# Patient Record
Sex: Male | Born: 1990 | Race: White | Hispanic: No | Marital: Single | Smoking: Current every day smoker
Health system: Southern US, Community
[De-identification: ages and names within clinical notes are randomized; demographics above are authoritative.]

---
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
## 2011-01-24 ENCOUNTER — Emergency Department (HOSPITAL_COMMUNITY)
Admission: EM | Admit: 2011-01-24 | Discharge: 2011-01-24 | Disposition: A | Discharge status: Home / Self Care | Payer: Medicaid Other | Attending: Emergency Medicine | Admitting: Emergency Medicine

## 2011-01-24 DIAGNOSIS — B86 Scabies: Secondary | ICD-10-CM | POA: Insufficient documentation

## 2011-05-05 LAB — RAPID URINE DRUG SCREEN, HOSP PERFORMED
Cocaine: NOT DETECTED
Opiates: NOT DETECTED

## 2011-05-05 LAB — BASIC METABOLIC PANEL
CO2: 31 mEq/L (ref 19–32)
Calcium: 9.5 mg/dL (ref 8.4–10.5)
Creatinine, Ser: 0.79 mg/dL (ref 0.4–1.5)
Glucose, Bld: 91 mg/dL (ref 70–99)

## 2011-05-05 LAB — URINALYSIS, ROUTINE W REFLEX MICROSCOPIC
Bilirubin Urine: NEGATIVE
Glucose, UA: NEGATIVE mg/dL
Ketones, ur: NEGATIVE mg/dL
pH: 7 (ref 5.0–8.0)

## 2011-05-05 LAB — DIFFERENTIAL
Basophils Absolute: 0 10*3/uL (ref 0.0–0.1)
Basophils Relative: 1 % (ref 0–1)
Neutro Abs: 5.5 10*3/uL (ref 1.7–8.0)
Neutrophils Relative %: 64 % (ref 43–71)

## 2011-05-05 LAB — CBC
MCHC: 33.5 g/dL (ref 31.0–37.0)
Platelets: 250 10*3/uL (ref 150–400)
RDW: 12.8 % (ref 11.4–15.5)

## 2014-07-22 ENCOUNTER — Emergency Department (HOSPITAL_COMMUNITY): Payer: Medicaid Other

## 2014-07-22 ENCOUNTER — Encounter (HOSPITAL_COMMUNITY): Payer: Self-pay

## 2014-07-22 ENCOUNTER — Emergency Department (HOSPITAL_COMMUNITY)
Admission: EM | Admit: 2014-07-22 | Discharge: 2014-07-22 | Disposition: A | Discharge status: Home / Self Care | Payer: Self-pay | Attending: Emergency Medicine | Admitting: Emergency Medicine

## 2014-07-22 DIAGNOSIS — S6992XA Unspecified injury of left wrist, hand and finger(s), initial encounter: Secondary | ICD-10-CM | POA: Insufficient documentation

## 2014-07-22 DIAGNOSIS — R042 Hemoptysis: Secondary | ICD-10-CM | POA: Insufficient documentation

## 2014-07-22 DIAGNOSIS — Y9289 Other specified places as the place of occurrence of the external cause: Secondary | ICD-10-CM | POA: Insufficient documentation

## 2014-07-22 DIAGNOSIS — R059 Cough, unspecified: Secondary | ICD-10-CM

## 2014-07-22 DIAGNOSIS — S99911A Unspecified injury of right ankle, initial encounter: Secondary | ICD-10-CM

## 2014-07-22 DIAGNOSIS — J40 Bronchitis, not specified as acute or chronic: Secondary | ICD-10-CM

## 2014-07-22 DIAGNOSIS — W19XXXA Unspecified fall, initial encounter: Secondary | ICD-10-CM

## 2014-07-22 DIAGNOSIS — S0081XA Abrasion of other part of head, initial encounter: Secondary | ICD-10-CM | POA: Insufficient documentation

## 2014-07-22 DIAGNOSIS — S3991XA Unspecified injury of abdomen, initial encounter: Secondary | ICD-10-CM | POA: Insufficient documentation

## 2014-07-22 DIAGNOSIS — S79911A Unspecified injury of right hip, initial encounter: Secondary | ICD-10-CM | POA: Insufficient documentation

## 2014-07-22 DIAGNOSIS — S20412A Abrasion of left back wall of thorax, initial encounter: Secondary | ICD-10-CM | POA: Insufficient documentation

## 2014-07-22 DIAGNOSIS — M79642 Pain in left hand: Secondary | ICD-10-CM

## 2014-07-22 DIAGNOSIS — S0990XA Unspecified injury of head, initial encounter: Secondary | ICD-10-CM

## 2014-07-22 DIAGNOSIS — S30810A Abrasion of lower back and pelvis, initial encounter: Secondary | ICD-10-CM | POA: Insufficient documentation

## 2014-07-22 DIAGNOSIS — D72829 Elevated white blood cell count, unspecified: Secondary | ICD-10-CM | POA: Insufficient documentation

## 2014-07-22 DIAGNOSIS — S4992XA Unspecified injury of left shoulder and upper arm, initial encounter: Secondary | ICD-10-CM | POA: Insufficient documentation

## 2014-07-22 DIAGNOSIS — Z720 Tobacco use: Secondary | ICD-10-CM | POA: Insufficient documentation

## 2014-07-22 DIAGNOSIS — S0003XA Contusion of scalp, initial encounter: Secondary | ICD-10-CM | POA: Insufficient documentation

## 2014-07-22 DIAGNOSIS — J209 Acute bronchitis, unspecified: Secondary | ICD-10-CM | POA: Insufficient documentation

## 2014-07-22 DIAGNOSIS — R05 Cough: Secondary | ICD-10-CM

## 2014-07-22 DIAGNOSIS — Y9351 Activity, roller skating (inline) and skateboarding: Secondary | ICD-10-CM | POA: Insufficient documentation

## 2014-07-22 DIAGNOSIS — S6990XA Unspecified injury of unspecified wrist, hand and finger(s), initial encounter: Secondary | ICD-10-CM

## 2014-07-22 DIAGNOSIS — S060X0A Concussion without loss of consciousness, initial encounter: Secondary | ICD-10-CM

## 2014-07-22 DIAGNOSIS — R52 Pain, unspecified: Secondary | ICD-10-CM

## 2014-07-22 DIAGNOSIS — Y998 Other external cause status: Secondary | ICD-10-CM | POA: Insufficient documentation

## 2014-07-22 LAB — URINALYSIS, ROUTINE W REFLEX MICROSCOPIC
Bilirubin Urine: NEGATIVE
GLUCOSE, UA: NEGATIVE mg/dL
HGB URINE DIPSTICK: NEGATIVE
Ketones, ur: 15 mg/dL — AB
Leukocytes, UA: NEGATIVE
Nitrite: NEGATIVE
PH: 5.5 (ref 5.0–8.0)
Protein, ur: NEGATIVE mg/dL
SPECIFIC GRAVITY, URINE: 1.03 (ref 1.005–1.030)
Urobilinogen, UA: 0.2 mg/dL (ref 0.0–1.0)

## 2014-07-22 LAB — COMPREHENSIVE METABOLIC PANEL
ALT: 16 U/L (ref 0–53)
AST: 25 U/L (ref 0–37)
Albumin: 4.6 g/dL (ref 3.5–5.2)
Alkaline Phosphatase: 57 U/L (ref 39–117)
Anion gap: 8 (ref 5–15)
BUN: 13 mg/dL (ref 6–23)
CALCIUM: 9.2 mg/dL (ref 8.4–10.5)
CO2: 24 mmol/L (ref 19–32)
Chloride: 108 mEq/L (ref 96–112)
Creatinine, Ser: 1.08 mg/dL (ref 0.50–1.35)
GFR calc Af Amer: >90 mL/min (ref >90)
GFR calc non Af Amer: >90 mL/min (ref >90)
Glucose, Bld: 102 mg/dL — ABNORMAL HIGH (ref 70–99)
POTASSIUM: 3.7 mmol/L (ref 3.5–5.1)
SODIUM: 140 mmol/L (ref 135–145)
TOTAL PROTEIN: 7.3 g/dL (ref 6.0–8.3)
Total Bilirubin: 0.6 mg/dL (ref 0.3–1.2)

## 2014-07-22 LAB — RAPID URINE DRUG SCREEN, HOSP PERFORMED
AMPHETAMINES: NOT DETECTED
BENZODIAZEPINES: POSITIVE — AB
Barbiturates: NOT DETECTED
COCAINE: POSITIVE — AB
OPIATES: NOT DETECTED
TETRAHYDROCANNABINOL: POSITIVE — AB

## 2014-07-22 LAB — CBC WITH DIFFERENTIAL/PLATELET
BASOS ABS: 0 10*3/uL (ref 0.0–0.1)
Basophils Relative: 0 % (ref 0–1)
EOS ABS: 0.2 10*3/uL (ref 0.0–0.7)
EOS PCT: 2 % (ref 0–5)
HCT: 43.6 % (ref 39.0–52.0)
Hemoglobin: 15.1 g/dL (ref 13.0–17.0)
LYMPHS PCT: 19 % (ref 12–46)
Lymphs Abs: 2.3 10*3/uL (ref 0.7–4.0)
MCH: 30.1 pg (ref 26.0–34.0)
MCHC: 34.6 g/dL (ref 30.0–36.0)
MCV: 87 fL (ref 78.0–100.0)
Monocytes Absolute: 0.8 10*3/uL (ref 0.1–1.0)
Monocytes Relative: 7 % (ref 3–12)
NEUTROS PCT: 72 % (ref 43–77)
Neutro Abs: 8.9 10*3/uL — ABNORMAL HIGH (ref 1.7–7.7)
PLATELETS: 286 10*3/uL (ref 150–400)
RBC: 5.01 MIL/uL (ref 4.22–5.81)
RDW: 12.1 % (ref 11.5–15.5)
WBC: 12.3 10*3/uL — AB (ref 4.0–10.5)

## 2014-07-22 LAB — TROPONIN I: Troponin I: <0.03 ng/mL (ref <0.031)

## 2014-07-22 LAB — ETHANOL: Alcohol, Ethyl (B): <5 mg/dL (ref 0–9)

## 2014-07-22 MED ORDER — ACETAMINOPHEN 325 MG PO TABS: 325.0000 mg | ORAL_TABLET | Freq: Four times a day (QID) | ORAL | Status: AC | PRN

## 2014-07-22 MED ORDER — SODIUM CHLORIDE 0.9 % IV BOLUS (SEPSIS)
500.0000 mL | Freq: Once | INTRAVENOUS | Status: AC
  Administered 2014-07-22: 500 mL via INTRAVENOUS

## 2014-07-22 MED ORDER — AZITHROMYCIN 250 MG PO TABS
250.0000 mg | ORAL_TABLET | Freq: Every day | ORAL | Status: AC
Start: 2014-07-22 — End: ?

## 2014-07-22 MED ORDER — ACETAMINOPHEN 325 MG PO TABS
650.0000 mg | ORAL_TABLET | Freq: Four times a day (QID) | ORAL | Status: DC | PRN
Start: 2014-07-22 — End: 2014-07-22

## 2014-07-22 MED ORDER — SODIUM CHLORIDE 0.9 % IV BOLUS (SEPSIS)
1000.0000 mL | Freq: Once | INTRAVENOUS | Status: AC
  Administered 2014-07-22: 1000 mL via INTRAVENOUS

## 2014-07-22 MED ORDER — AZITHROMYCIN 250 MG PO TABS: 250.0000 mg | ORAL_TABLET | Freq: Every day | ORAL | Status: DC

## 2014-07-22 MED ORDER — ALBUTEROL SULFATE HFA 108 (90 BASE) MCG/ACT IN AERS
2.0000 | INHALATION_SPRAY | Freq: Once | RESPIRATORY_TRACT | Status: AC
  Administered 2014-07-22: 2 via RESPIRATORY_TRACT
  Filled 2014-07-22: qty 6.7

## 2014-07-22 NOTE — ED Notes (Signed)
Paged ortho to place thumb spica.  
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
## 2014-07-22 NOTE — ED Notes (Signed)
Pt still in radiology.

## 2014-07-22 NOTE — ED Notes (Signed)
Patient is not answering the pager [phone] and not answering by name.  Called [phone]

## 2014-07-22 NOTE — ED Notes (Signed)
Pt's friend brought pt's cell phone and charger to room

## 2014-07-22 NOTE — ED Notes (Signed)
Placed cam walker on pt's right ankle

## 2014-07-22 NOTE — Progress Notes (Signed)
Orthopedic Tech Progress Note Patient Details:  Lucy Chrishomas D Wragg April 08, 1991 [id number]  Ortho Devices Type of Ortho Device: Thumb velcro splint, Crutches Ortho Device/Splint Location: LUE Ortho Device/Splint Interventions: Ordered, Application, Adjustment   Jennye MoccasinHughes, Viviann Broyles Craig 07/22/2014, 6:57 PM

## 2014-07-22 NOTE — Discharge Instructions (Signed)
Please call your doctor for a followup appointment within 24-48 hours. When you talk to your doctor please let them know that you were seen in the emergency department and have them acquire all of your records so that they can discuss the findings with you and formulate a treatment plan to fully care for your new and ongoing problems. Please call and set-up an appointment with hand specialist and orthopedics Please call and set-up an appointment with health and wellness center Please avoid any physical or strenuous activity  Please wear a helmet at all times Please no physical activity - if you have another head injury this can lead to irreversible brain damage Please continue to monitor symptoms closely and if symptoms are to worsen or change (fever greater than 101, chills, sweating, nausea, vomiting, chest pain, shortness of breathe, difficulty breathing, weakness, numbness, tingling, worsening or changes to pain pattern, fall, injury, neck pain, dizziness, visual changes, inability to keep food or fluids down) please report back to the Emergency Department immediately.   Concussion A concussion, or closed-head injury, is a brain injury caused by a direct blow to the head or by a quick and sudden movement (jolt) of the head or neck. Concussions are usually not life-threatening. Even so, the effects of a concussion can be serious. If you have had a concussion before, you are more likely to experience concussion-like symptoms after a direct blow to the head.  CAUSES  Direct blow to the head, such as from running into another player during a soccer game, being hit in a fight, or hitting your head on a hard surface.  A jolt of the head or neck that causes the brain to move back and forth inside the skull, such as in a car crash. SIGNS AND SYMPTOMS The signs of a concussion can be hard to notice. Early on, they may be missed by you, family members, and health care providers. You may look fine but act  or feel differently. Symptoms are usually temporary, but they may last for days, weeks, or even longer. Some symptoms may appear right away while others may not show up for hours or days. Every head injury is different. Symptoms include:  Mild to moderate headaches that will not go away.  A feeling of pressure inside your head.  Having more trouble than usual:  Learning or remembering things you have heard.  Answering questions.  Paying attention or concentrating.  Organizing daily tasks.  Making decisions and solving problems.  Slowness in thinking, acting or reacting, speaking, or reading.  Getting lost or being easily confused.  Feeling tired all the time or lacking energy (fatigued).  Feeling drowsy.  Sleep disturbances.  Sleeping more than usual.  Sleeping less than usual.  Trouble falling asleep.  Trouble sleeping (insomnia).  Loss of balance or feeling lightheaded or dizzy.  Nausea or vomiting.  Numbness or tingling.  Increased sensitivity to:  Sounds.  Lights.  Distractions.  Vision problems or eyes that tire easily.  Diminished sense of taste or smell.  Ringing in the ears.  Mood changes such as feeling sad or anxious.  Becoming easily irritated or angry for little or no reason.  Lack of motivation.  Seeing or hearing things other people do not see or hear (hallucinations). DIAGNOSIS Your health care provider can usually diagnose a concussion based on a description of your injury and symptoms. He or she will ask whether you passed out (lost consciousness) and whether you are having trouble remembering events  that happened right before and during your injury. Your evaluation might include:  A brain scan to look for signs of injury to the brain. Even if the test shows no injury, you may still have a concussion.  Blood tests to be sure other problems are not present. TREATMENT  Concussions are usually treated in an emergency department,  in urgent care, or at a clinic. You may need to stay in the hospital overnight for further treatment.  Tell your health care provider if you are taking any medicines, including prescription medicines, over-the-counter medicines, and natural remedies. Some medicines, such as blood thinners (anticoagulants) and aspirin, may increase the chance of complications. Also tell your health care provider whether you have had alcohol or are taking illegal drugs. This information may affect treatment.  Your health care provider will send you home with important instructions to follow.  How fast you will recover from a concussion depends on many factors. These factors include how severe your concussion is, what part of your brain was injured, your age, and how healthy you were before the concussion.  Most people with mild injuries recover fully. Recovery can take time. In general, recovery is slower in older persons. Also, persons who have had a concussion in the past or have other medical problems may find that it takes longer to recover from their current injury. HOME CARE INSTRUCTIONS General Instructions  Carefully follow the directions your health care provider gave you.  Only take over-the-counter or prescription medicines for pain, discomfort, or fever as directed by your health care provider.  Take only those medicines that your health care provider has approved.  Do not drink alcohol until your health care provider says you are well enough to do so. Alcohol and certain other drugs may slow your recovery and can put you at risk of further injury.  If it is harder than usual to remember things, write them down.  If you are easily distracted, try to do one thing at a time. For example, do not try to watch TV while fixing dinner.  Talk with family members or close friends when making important decisions.  Keep all follow-up appointments. Repeated evaluation of your symptoms is recommended for your  recovery.  Watch your symptoms and tell others to do the same. Complications sometimes occur after a concussion. Older adults with a brain injury may have a higher risk of serious complications, such as a blood clot on the brain.  Tell your teachers, school nurse, school counselor, coach, athletic trainer, or work Production designer, theatre/television/film about your injury, symptoms, and restrictions. Tell them about what you can or cannot do. They should watch for:  Increased problems with attention or concentration.  Increased difficulty remembering or learning new information.  Increased time needed to complete tasks or assignments.  Increased irritability or decreased ability to cope with stress.  Increased symptoms.  Rest. Rest helps the brain to heal. Make sure you:  Get plenty of sleep at night. Avoid staying up late at night.  Keep the same bedtime hours on weekends and weekdays.  Rest during the day. Take daytime naps or rest breaks when you feel tired.  Limit activities that require a lot of thought or concentration. These include:  Doing homework or job-related work.  Watching TV.  Working on the computer.  Avoid any situation where there is potential for another head injury (football, hockey, soccer, basketball, martial arts, downhill snow sports and horseback riding). Your condition will get worse every time you  experience a concussion. You should avoid these activities until you are evaluated by the appropriate follow-up health care providers. Returning To Your Regular Activities You will need to return to your normal activities slowly, not all at once. You must give your body and brain enough time for recovery.  Do not return to sports or other athletic activities until your health care provider tells you it is safe to do so.  Ask your health care provider when you can drive, ride a bicycle, or operate heavy machinery. Your ability to react may be slower after a brain injury. Never do these  activities if you are dizzy.  Ask your health care provider about when you can return to work or school. Preventing Another Concussion It is very important to avoid another brain injury, especially before you have recovered. In rare cases, another injury can lead to permanent brain damage, brain swelling, or death. The risk of this is greatest during the first 7-10 days after a head injury. Avoid injuries by:  Wearing a seat belt when riding in a car.  Drinking alcohol only in moderation.  Wearing a helmet when biking, skiing, skateboarding, skating, or doing similar activities.  Avoiding activities that could lead to a second concussion, such as contact or recreational sports, until your health care provider says it is okay.  Taking safety measures in your home.  Remove clutter and tripping hazards from floors and stairways.  Use grab bars in bathrooms and handrails by stairs.  Place non-slip mats on floors and in bathtubs.  Improve lighting in dim areas. SEEK MEDICAL CARE IF:  You have increased problems paying attention or concentrating.  You have increased difficulty remembering or learning new information.  You need more time to complete tasks or assignments than before.  You have increased irritability or decreased ability to cope with stress.  You have more symptoms than before. Seek medical care if you have any of the following symptoms for more than 2 weeks after your injury:  Lasting (chronic) headaches.  Dizziness or balance problems.  Nausea.  Vision problems.  Increased sensitivity to noise or light.  Depression or mood swings.  Anxiety or irritability.  Memory problems.  Difficulty concentrating or paying attention.  Sleep problems.  Feeling tired all the time. SEEK IMMEDIATE MEDICAL CARE IF:  You have severe or worsening headaches. These may be a sign of a blood clot in the brain.  You have weakness (even if only in one hand, leg, or part of  the face).  You have numbness.  You have decreased coordination.  You vomit repeatedly.  You have increased sleepiness.  One pupil is larger than the other.  You have convulsions.  You have slurred speech.  You have increased confusion. This may be a sign of a blood clot in the brain.  You have increased restlessness, agitation, or irritability.  You are unable to recognize people or places.  You have neck pain.  It is difficult to wake you up.  You have unusual behavior changes.  You lose consciousness. MAKE SURE YOU:  Understand these instructions.  Will watch your condition.  Will get help right away if you are not doing well or get worse. Document Released: 10/07/2003 Document Revised: 07/22/2013 Document Reviewed: 02/06/2013 [facility] Patient Information 2015 [place], [place]. This information is not intended to replace advice given to you by your health care provider. Make sure you discuss any questions you have with your health care provider.  Post-Concussion Syndrome Post-concussion syndrome  means you have problems after a head injury. The problems can last for weeks or months. The problems usually go away on their own over time. HOME CARE   Only take medicines as told by your doctor. Do not take aspirin.  Sleep with your head raised (elevated) to help with headaches.  Avoid activities that can cause another head injury.  Do not play contact sports like football, hockey, soccer or basketball. Do not do other risky activities like downhill skiing or martial arts, or ride horses until your doctor says it is OK.  Keep all doctor visits as told. GET HELP RIGHT AWAY IF:  You feel confused or very sleepy.  You cannot wake the injured person.  You feel sick to your stomach (nauseous) or keep throwing up (vomiting).  You feel like you are moving when you are not (vertigo).  You notice the injured person's eyes moving back and forth very fast.  You start  shaking (convulsing) or pass out (faint).  You have very bad headaches that do not get better with medicine.  You cannot use your arms or legs normally.  The black centers of your eyes (pupils) change size.  You have clear or bloody fluid coming from your nose or ears.  Your problems get worse, not better. MAKE SURE YOU:  Understand these instructions.  Will watch your condition.  Will get help right away if you are not doing well or get worse. Document Released: 08/24/2004 Document Revised: 05/07/2013 Document Reviewed: 10/22/2013 [facility] Patient Information 2015 [place]. This information is not intended to replace advice given to you by your health care provider. Make sure you discuss any questions you have with your health care provider.   Emergency Department Resource Guide 1) Find a Doctor and Pay Out of Pocket Although you won't have to find out who is covered by your insurance plan, it is a good idea to ask around and get recommendations. You will then need to call the office and see if the doctor you have chosen will accept you as a new patient and what types of options they offer for patients who are self-pay. Some doctors offer discounts or will set up payment plans for their patients who do not have insurance, but you will need to ask so you aren't surprised when you get to your appointment.  2) Contact Your Local Health Department Not all health departments have doctors that can see patients for sick visits, but many do, so it is worth a call to see if yours does. If you don't know where your local health department is, you can check in your phone book. The CDC also has a tool to help you locate your state's health department, and many state websites also have listings of all of their local health departments.  3) Find a Walk-in Clinic If your illness is not likely to be very severe or complicated, you may want to try a walk in clinic. These are popping up all over  the country in pharmacies, drugstores, and shopping centers. They're usually staffed by nurse practitioners or physician assistants that have been trained to treat common illnesses and complaints. They're usually fairly quick and inexpensive. However, if you have serious medical issues or chronic medical problems, these are probably not your best option.  No Primary Care Doctor: - Call Health Connect at  [phone] - they can help you locate a primary care doctor that  accepts your insurance, provides certain services, etc. - Physician Referral  Service- [phone]  Chronic Pain Problems: Organization         Address  Phone   Notes  [facility]  [phone]) [phone] Patients need to be referred by their primary care doctor.   Medication Assistance: Organization         Address  Phone   Notes  [facility] Medication [facility] [address] [place], [place] [address] [phone]) [phone] --Must be a resident of [facility] -- Must have NO insurance coverage whatsoever (no Medicaid/ Medicare, etc.) -- The pt. MUST have a primary care doctor that directs their care regularly and follows them in the community   Med[facility]ist  [phone]) [phone]   [employer]  [phone]    Agencies that provide inexpensive medical care: Organization         Address  Phone   Notes  [facility]  [phone]) [phone]   [facility]    [phone]) [phone]   [facility] [address] [place], [place] [address] [phone]) [phone]   [facility] [address], [place] [phone]) [phone]   [facility]    [phone]   [facility]    [phone]   Community Health and [facility]  [address], [place] Phone:  [phone]) [phone], Fax:  [phone] Hours of Operation:  9 am - 6 pm, M-F.  Also accepts Medicaid/Medicare and self-pay.  [facility]  [address]  [address], [place] Phone: [phone]) [phone], Fax: [phone]) [phone]. Hours of Operation:  8:30 am - 5:30 pm, M-F.  Also accepts Medicaid and self-pay.  [facility] [place] [address], [place] Phone: [phone]) [phone]   [facility] [address], [place], [place] [phone], Ext. [phone] Mondays & Thursdays: 7-9 AM.  First 15 patients are seen on a first come, first serve basis.    Medicaid-accepting [facility] Providers:  Organization         Address  Phone   Notes  [facility] [address], [place] [phone] Also accepts self-pay patients.  [facility] [address], [place]  [phone]) [phone]   [facility] [address], [place] [phone]) [phone]   [facility] [address] [place] [address], [place] [phone]) [phone]   Renaye RakersVeita Bland [address], [place]   [phone] Only accepts [place] Access [place] patients after they have their name applied to their card.   Self-Pay (no insurance) in [facility]:  Organization         Address  Phone   Notes  Sickle Cell Patients, [facility] [address], [place] [phone]   [facility] [address], [place] [phone]) [phone]   [facility]  [address], [place] [phone]   [facility]/Dr. Osei-Bonsu  [address] [place] [address], [place] or [id number], [place] [phone]) [phone] Phone number for both [place] and [place] locations is the same.  [facility] [address], [place] [phone]   [facility] [place] [address] [place] [address], [place] or [address] [phone]) [phone]   [facility] [address], [place] [phone]) [phone], phone; [phone](336) [phone],  fax Sees patients 1st and 3rd Saturday of every month.   Must not qualify for public or private insurance (i.e. Medicaid, Medicare, [place] Health Choice, Veterans' Benefits)  Household income should be no more than 200% of the poverty level The clinic cannot treat you if you are pregnant or think you are pregnant  Sexually transmitted diseases are not treated at the clinic.    Dental Care: Organization         Address  Phone  Notes  [facility] Department of [facility] [address] [place], [place] [phone] Accepts children up to age 44 who are enrolled in [place] or [place] Health Choice; pregnant women with a Medicaid card; and children who have applied for Medicaid or [place] Health Choice, but were declined, whose parents can pay a reduced fee at time of service.  [facility] of [facility]  [address], [place] [phone] Accepts children up to age 55 who are enrolled in [place] or [place] Health Choice; pregnant women with a Medicaid card; and children who have applied for Medicaid or [place] Health Choice, but were declined, whose parents can pay a reduced fee at time of service.  [facility]  [address] [place], [place] [phone] Patients are seen by appointment only. Walk-ins are not accepted. [facility] will see patients 23 years of age and older. Monday - Tuesday (8am-5pm) Most Wednesdays (8:30-5pm) $30 per visit, cash only  [facility]  [address], [facility] [phone] Patients are seen by appointment only. Walk-ins are not accepted. [facility] will see patients 87 years of age and older. One Wednesday Evening (Monthly: Volunteer Based).  $30 per visit, cash only  [employer]  [phone] for adults; Children under age 27, call Graduate Pediatric Dentistry at [phone]. Children aged 78-14, please call [phone] to request a pediatric application.  Dental services are  provided in all areas of dental care including fillings, crowns and bridges, complete and partial dentures, implants, gum treatment, root canals, and extractions. Preventive care is also provided. Treatment is provided to both adults and children. Patients are selected via a lottery and there is often a waiting list.   [facility] [address], [place]  [phone] [email or web address]   [facility] [address] [place], [place] [phone], Ext. [phone] Second and Fourth Thursday of each month, opens at 6:30 AM; Clinic ends at 9 AM.  Patients are seen on a first-come first-served basis, and a limited number are seen during each clinic.   [facility]  [address] [place], [place] [phone]   Eligibility Requirements You must have lived in [place], [place], or [place] counties for at least the last three months.   You cannot be eligible for state or federal sponsored [place], including [employer], [place], or [employer].   You generally cannot be eligible for healthcare insurance through your employer.    How to apply: Eligibility screenings are held every Tuesday and Wednesday afternoon from 1:00 pm until 4:00 pm. You do not need an appointment for the interview!  [facility] [address], [place], [place] [phone]   [facility] Health Department  [phone]   [facility] Health Department  [phone]   [facility] Health Department  [phone]    Behavioral Health Resources in the Community: Intensive  Outpatient Programs Organization         Address  Phone  Notes  [facility] Services [address], [place], [place] [phone]   [facility] [address] [place], [place] [phone]   [facility] [address] [place], [place]  [phone]   [facility] Mental Health [address]  [place], [place]  [phone] or [phone]   Substance Abuse Resources Organization         Address  Phone  Notes  Alcohol and Drug Services  [phone]   [facility]  [phone]   [facility] [place]  [phone]   Floydene Flock  [phone]   Residential & Outpatient Substance Abuse Program  [phone]   Psychological Services Organization         Address  Phone  Notes  [facility] Behavioral Health  [phone]   [facility] Services  [phone]   [facility] Mental Health [address] [place] [phone] or [phone]    Mobile Crisis Teams Organization         Address  Phone  Notes  [facility] Mobile Crisis Care Unit  [phone]   [facility]  [address] [place], [place] [phone]   Doristine Locks [address] [place] [phone]    Self-Help/Support Groups Organization         Address  Phone             Notes  [facility] - variety of support groups  [phone]- [phone] Call for more information  Narcotics Anonymous (NA), [facility] [address], [employer] [place]  2 meetings at this location   Statistician         Address  Phone  Notes  [facility] [address],    [place]  [phone]   [facility]  [address], [place] [address], [place], [place] [phone]   [facility] [address] [place], [place] [phone] Admissions: 8am-3pm M-F  [facility] [address],    [place], [place] [phone]   [facility] [address] [place], [place], [place] [phone]   [facility] [address],  [place], [place] [phone]   [facility] Intensive Outpatient [address], [place], [place] [phone]   [facility] [address] [place]  [place], [place] [phone] or  [phone]   [facility] [address] [place], [place] [phone] Accepts Medicaid  [facility] [address],  [place] [phone] Substance Abuse/Addiction Treatment   [facility]         Address  Phone  Notes  [facility]  [phone]   Angie Fava, PhD [address] [place], [place]   [phone] or [phone]   [facility]   [address] [place], [place] [phone]   [facility] [address] [place], [place] [phone] Insurance/Medicaid/sponsorship through [employer] and Families [address]                                    [place], [place] [phone] Therapy/tele-psych/case  [facility] [address], [place] [phone]  Dr. Adele Schilder  [phone]   [facility] Dept. 1) [address], [place] 2) [place] 3)  [place] [address], [place] [phone]  [phone]   [place] [phone] or [phone] (After Hours)

## 2014-07-22 NOTE — ED Notes (Signed)
Declined W/C at D/C and was escorted to lobby by RN.Declined W/C at D/C and was escorted to lobby by RN. 
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
